# Patient Record
Sex: Male | Born: 1980
Health system: Southern US, Community
[De-identification: ages and names within clinical notes are randomized; demographics above are authoritative.]

---

## 2008-09-25 ENCOUNTER — Ambulatory Visit: Payer: Self-pay | Admitting: Internal Medicine

## 2010-08-21 ENCOUNTER — Ambulatory Visit: Payer: Self-pay | Admitting: Internal Medicine

## 2011-01-06 ENCOUNTER — Ambulatory Visit: Payer: Self-pay | Admitting: Internal Medicine

## 2011-03-09 IMAGING — CR RIGHT MIDDLE FINGER 2+V
1 series · 3 of 3 positions shown · non-contrast
Comparison: none

REASON FOR EXAM: injury
COMMENTS:   LMP: (Male)

PROCEDURE:     MDR - MDR FINGER MID 3RD DIGIT RT HA  - September 25, 2008 [DATE]
RESULT:     Comparison:  None

[Series 1: view not recorded · 0.17mm/px · 3 of 3 slices shown]
[im 1/3]
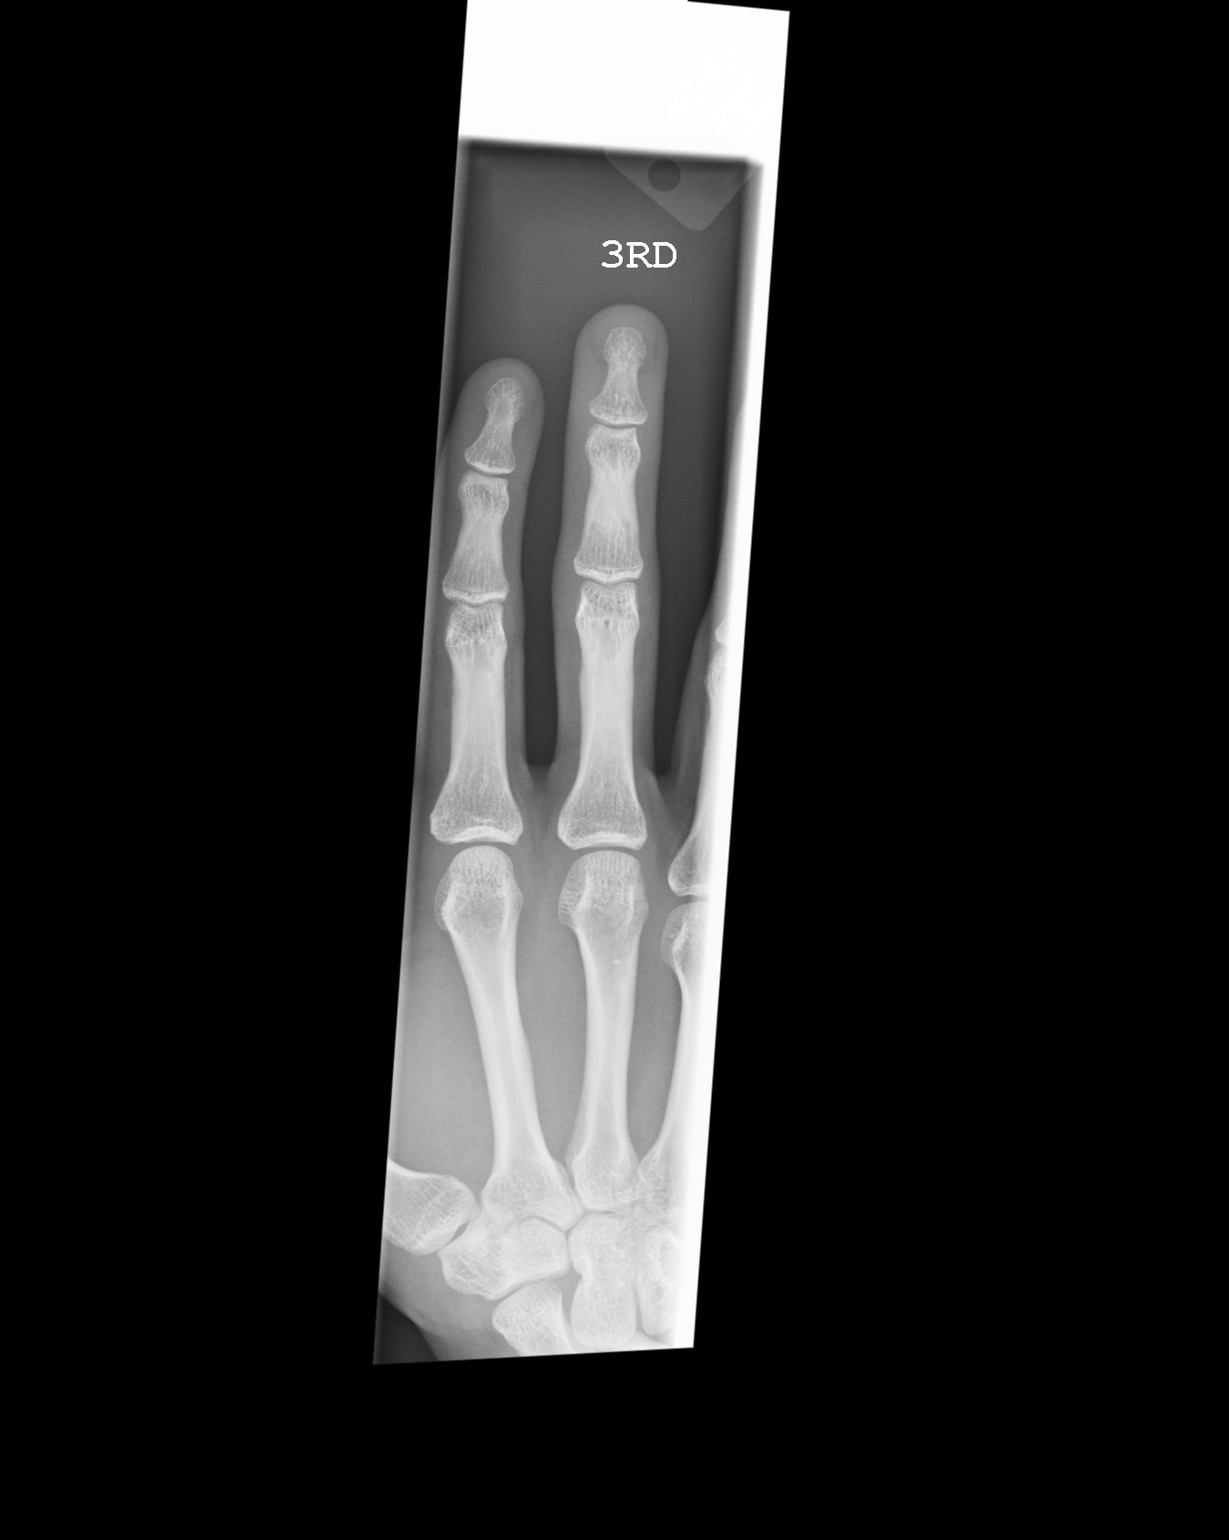
[im 2/3]
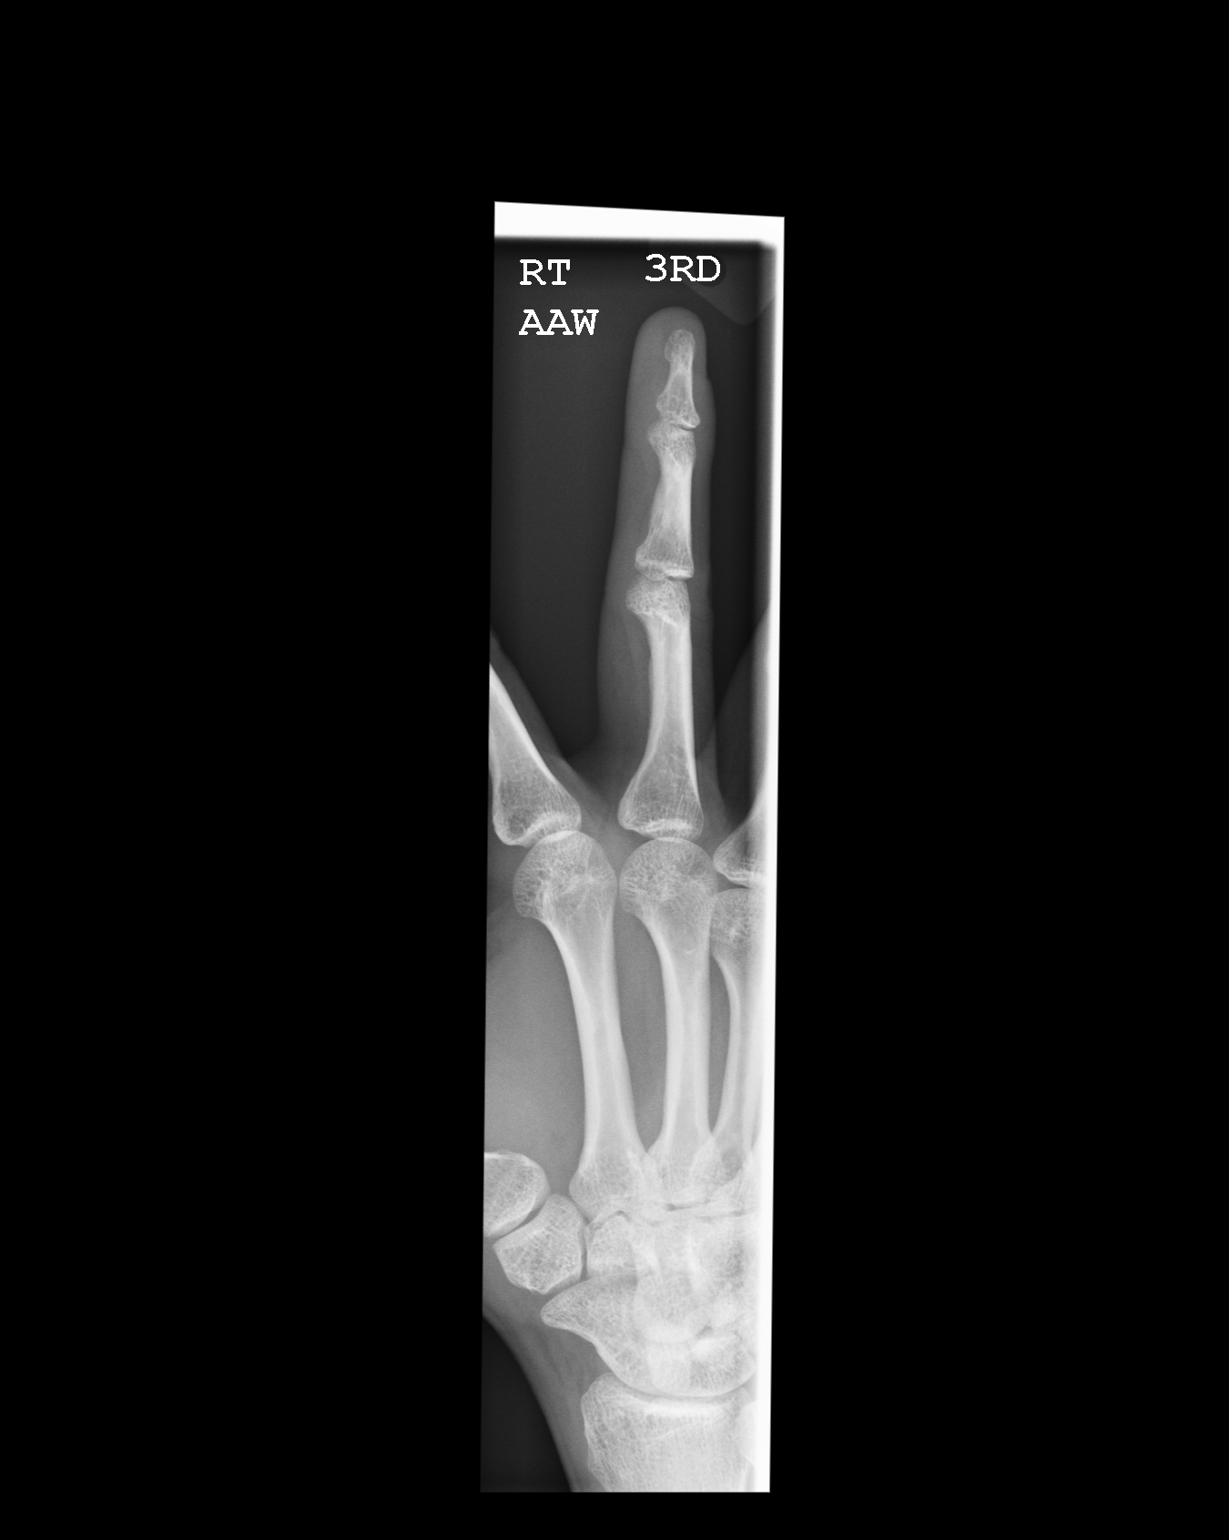
[im 3/3]
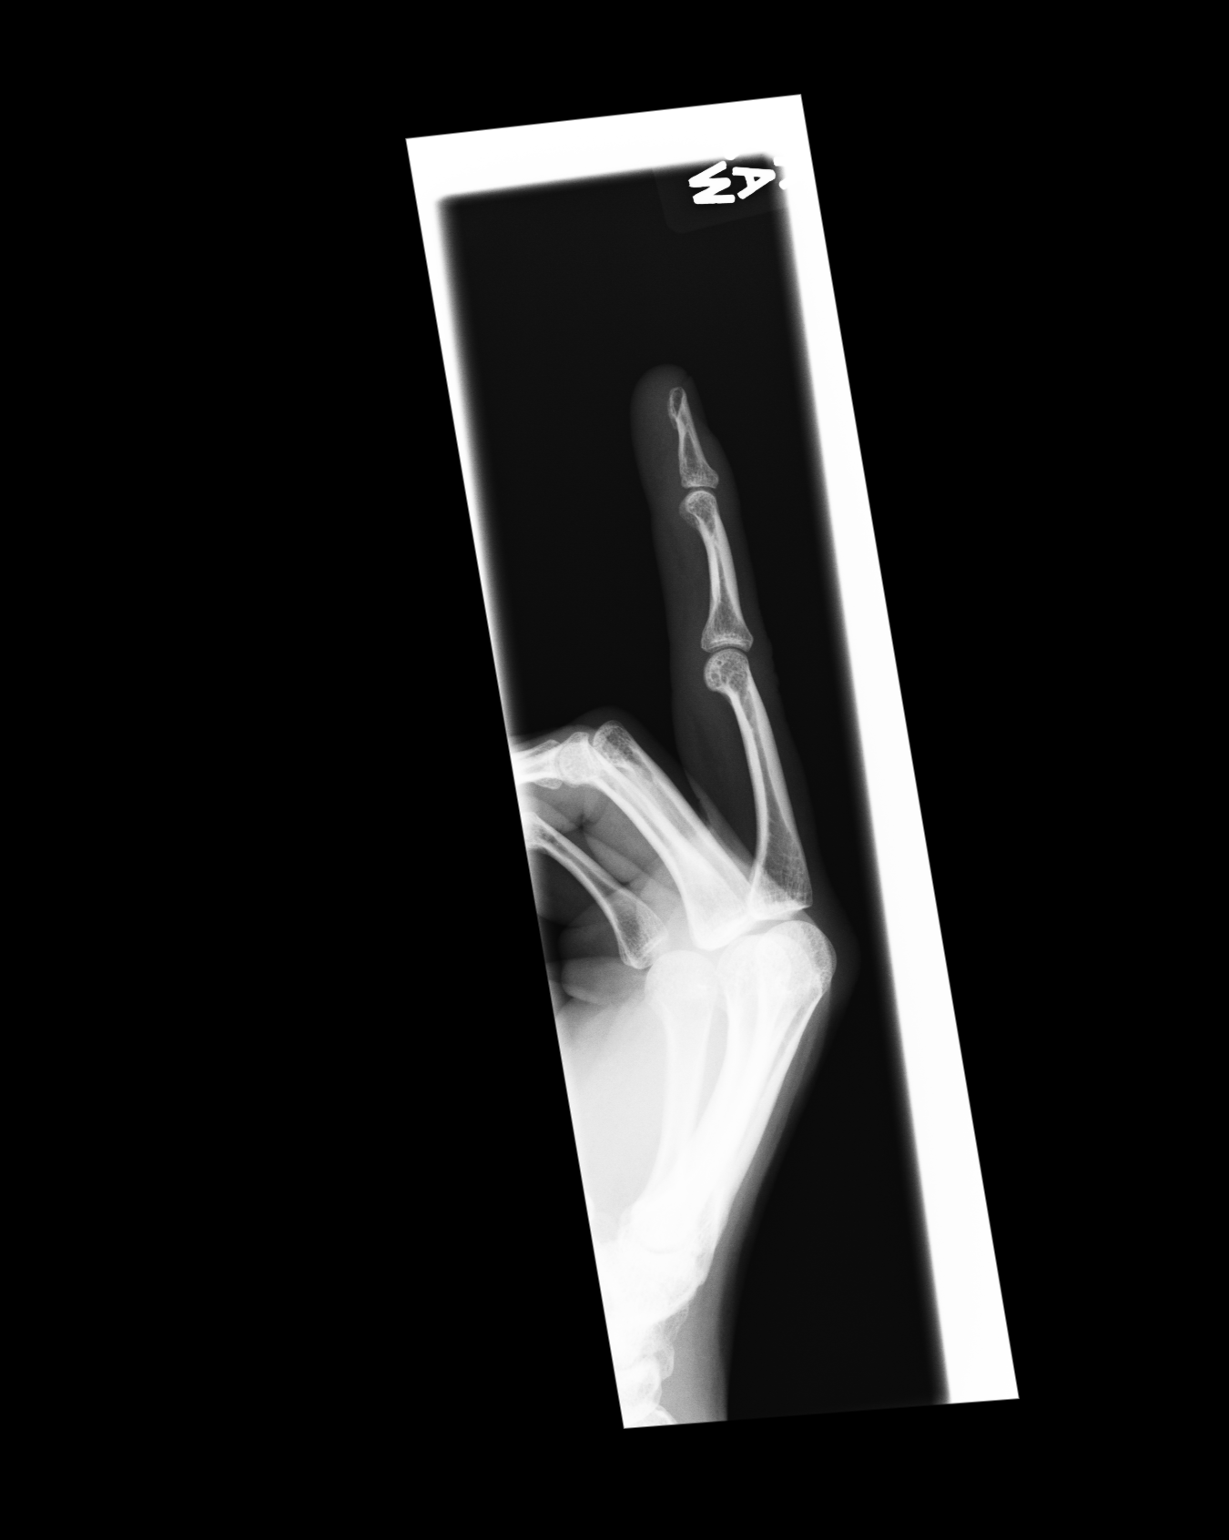

[3 of 3 positions shown; findings below may reference images not displayed]

FINDINGS: Three coned-down views of the right third digit demonstrates no fracture or
dislocation. The soft tissues are normal.
IMPRESSION: No acute osseous injury of the right third digit .

## 2011-12-14 ENCOUNTER — Ambulatory Visit: Payer: Self-pay

## 2013-06-19 IMAGING — CR DG CHEST 2V
1 series · 2 of 2 positions shown · non-contrast
Comparison: none

REASON FOR EXAM: congestion
COMMENTS:   LMP: (Male)

PROCEDURE:     MDR - MDR CHEST PA(OR AP) AND LATERAL  - January 06, 2011 [DATE]
RESULT:     The lungs are clear. The cardiac silhouette and visualized bony
skeleton are unremarkable.

[Series 1: pa · 0.17mm/px · 2 of 2 slices shown]
[im 1/2]
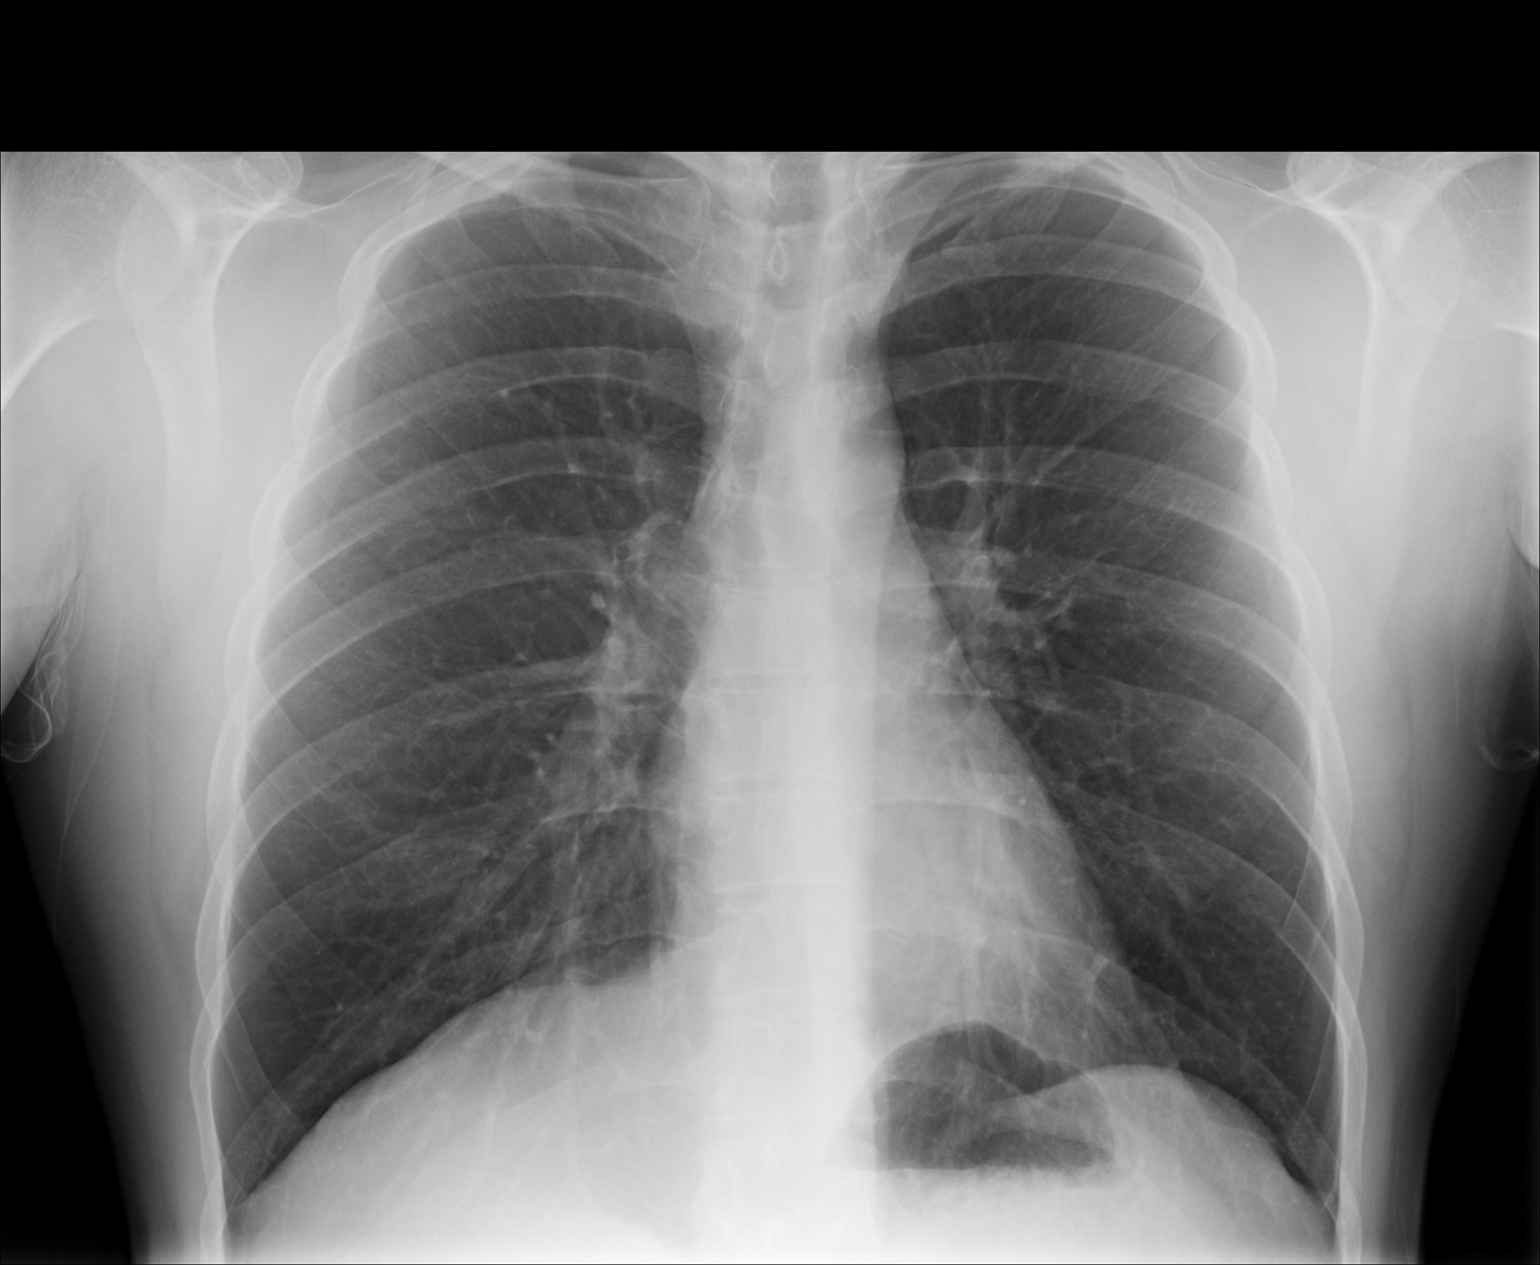
[im 2/2]
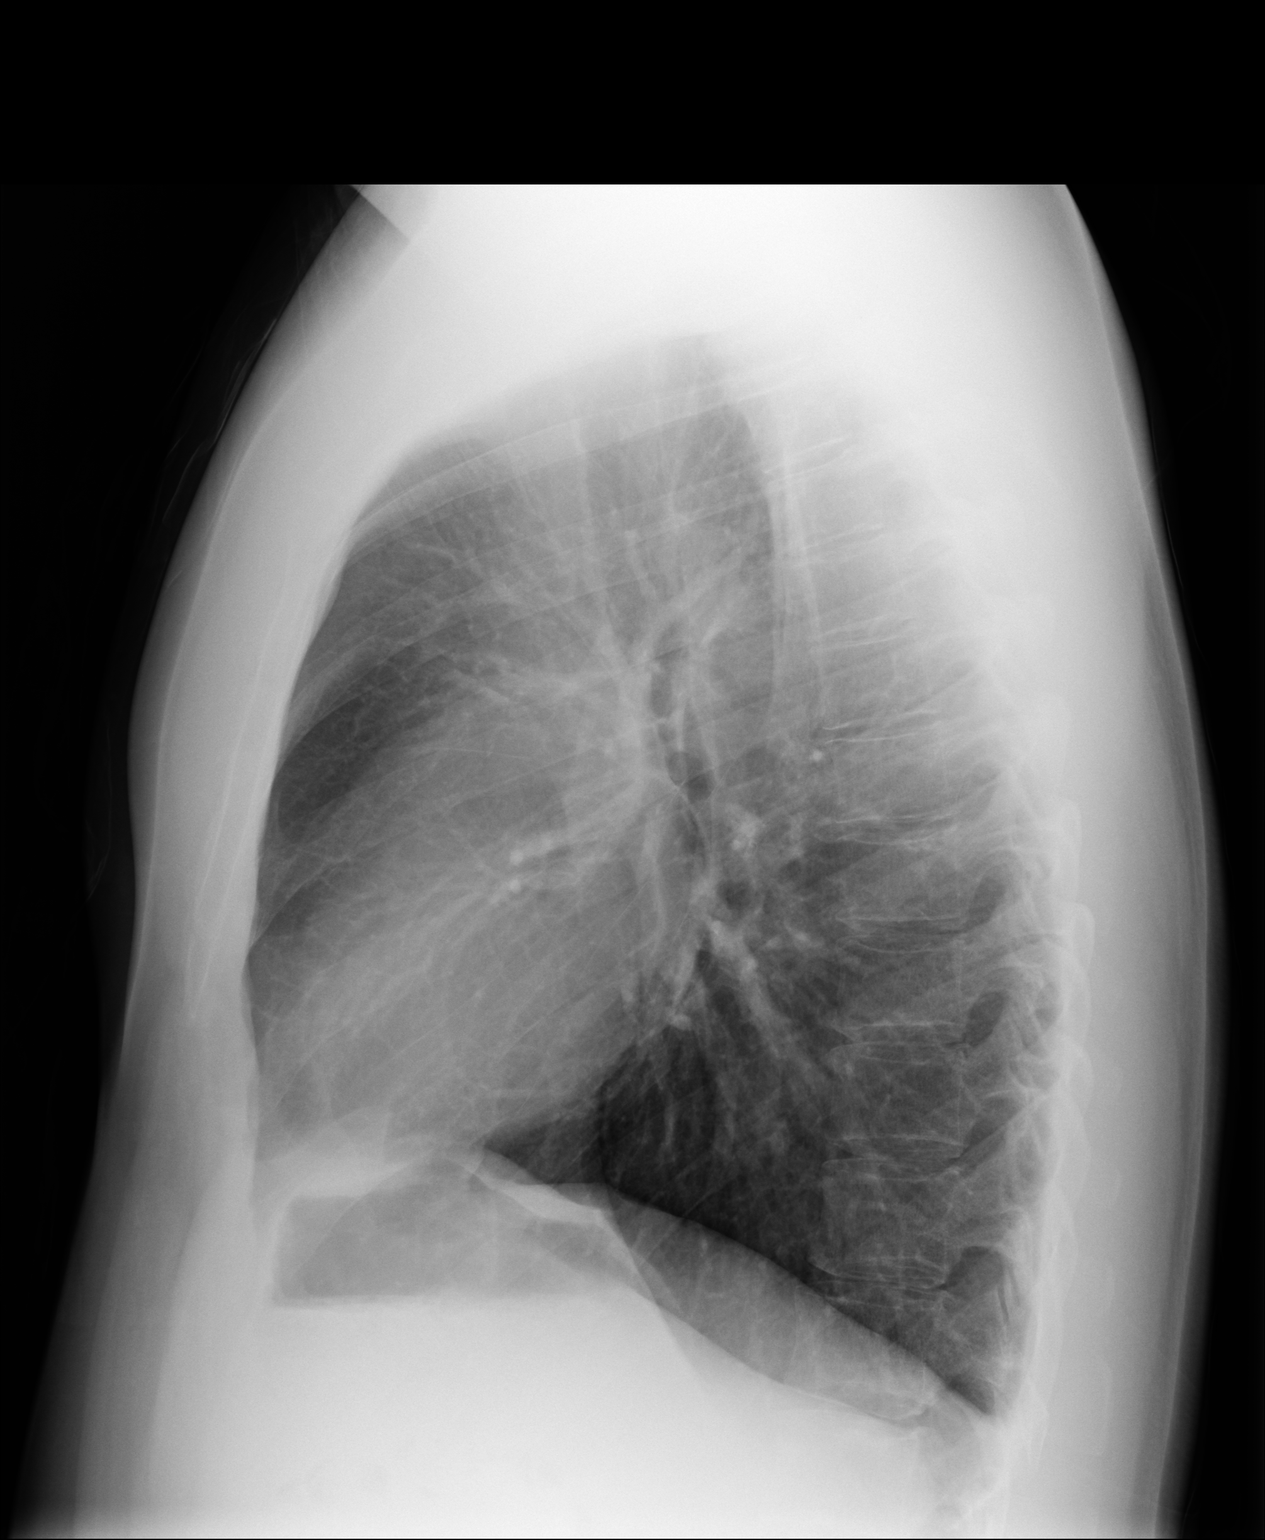

[2 of 2 positions shown; findings below may reference images not displayed]

IMPRESSION: 1. Chest radiograph without evidence of acute cardiopulmonary disease.

## 2013-09-12 ENCOUNTER — Ambulatory Visit: Payer: Self-pay

## 2016-02-10 DIAGNOSIS — Z0000 Encounter for general adult medical examination without abnormal findings: Secondary | ICD-10-CM | POA: Diagnosis not present

## 2016-02-10 DIAGNOSIS — Z13220 Encounter for screening for lipoid disorders: Secondary | ICD-10-CM | POA: Diagnosis not present

## 2016-03-27 DIAGNOSIS — R748 Abnormal levels of other serum enzymes: Secondary | ICD-10-CM | POA: Diagnosis not present

## 2016-03-27 DIAGNOSIS — R740 Nonspecific elevation of levels of transaminase and lactic acid dehydrogenase [LDH]: Secondary | ICD-10-CM | POA: Diagnosis not present

## 2016-05-14 DIAGNOSIS — R591 Generalized enlarged lymph nodes: Secondary | ICD-10-CM | POA: Diagnosis not present

## 2016-09-22 DIAGNOSIS — J0101 Acute recurrent maxillary sinusitis: Secondary | ICD-10-CM | POA: Diagnosis not present

## 2016-10-22 DIAGNOSIS — J069 Acute upper respiratory infection, unspecified: Secondary | ICD-10-CM | POA: Diagnosis not present

## 2016-10-22 DIAGNOSIS — R590 Localized enlarged lymph nodes: Secondary | ICD-10-CM | POA: Diagnosis not present

## 2016-12-02 DIAGNOSIS — Z23 Encounter for immunization: Secondary | ICD-10-CM | POA: Diagnosis not present

## 2017-02-11 DIAGNOSIS — L308 Other specified dermatitis: Secondary | ICD-10-CM | POA: Diagnosis not present

## 2017-02-11 DIAGNOSIS — E7800 Pure hypercholesterolemia, unspecified: Secondary | ICD-10-CM | POA: Diagnosis not present

## 2017-02-11 DIAGNOSIS — Z0000 Encounter for general adult medical examination without abnormal findings: Secondary | ICD-10-CM | POA: Diagnosis not present

## 2017-12-18 DIAGNOSIS — Z23 Encounter for immunization: Secondary | ICD-10-CM | POA: Diagnosis not present

## 2018-02-17 ENCOUNTER — Other Ambulatory Visit: Payer: Self-pay | Admitting: Family Medicine

## 2018-02-17 DIAGNOSIS — R748 Abnormal levels of other serum enzymes: Secondary | ICD-10-CM

## 2018-02-19 ENCOUNTER — Ambulatory Visit
Admission: RE | Admit: 2018-02-19 | Discharge: 2018-02-19 | Disposition: A | Discharge status: Home / Self Care | Payer: 59 | Source: Ambulatory Visit | Attending: Family Medicine | Admitting: Family Medicine

## 2018-02-19 DIAGNOSIS — R748 Abnormal levels of other serum enzymes: Secondary | ICD-10-CM

## 2018-05-26 DIAGNOSIS — R740 Nonspecific elevation of levels of transaminase and lactic acid dehydrogenase [LDH]: Secondary | ICD-10-CM | POA: Diagnosis not present

## 2018-05-26 DIAGNOSIS — D72819 Decreased white blood cell count, unspecified: Secondary | ICD-10-CM | POA: Diagnosis not present

## 2018-05-26 DIAGNOSIS — Z789 Other specified health status: Secondary | ICD-10-CM | POA: Diagnosis not present

## 2018-05-26 DIAGNOSIS — E782 Mixed hyperlipidemia: Secondary | ICD-10-CM | POA: Diagnosis not present

## 2018-08-25 DIAGNOSIS — R740 Nonspecific elevation of levels of transaminase and lactic acid dehydrogenase [LDH]: Secondary | ICD-10-CM | POA: Diagnosis not present

## 2018-08-25 DIAGNOSIS — D72819 Decreased white blood cell count, unspecified: Secondary | ICD-10-CM | POA: Diagnosis not present

## 2018-12-08 ENCOUNTER — Other Ambulatory Visit: Payer: Self-pay

## 2018-12-08 DIAGNOSIS — Z20822 Contact with and (suspected) exposure to covid-19: Secondary | ICD-10-CM

## 2018-12-10 LAB — NOVEL CORONAVIRUS, NAA: SARS-CoV-2, NAA: NOT DETECTED

## 2019-04-22 IMAGING — US US ABDOMEN COMPLETE
1 series · 14 of 25 positions shown · non-contrast
Comparison: None.

CLINICAL DATA: Elevated liver function tests.

EXAM:
ABDOMEN ULTRASOUND COMPLETE

[Series 1: us abdomen complete · 0.19mm/px · 14 of 83 slices shown]
[im 1/83]
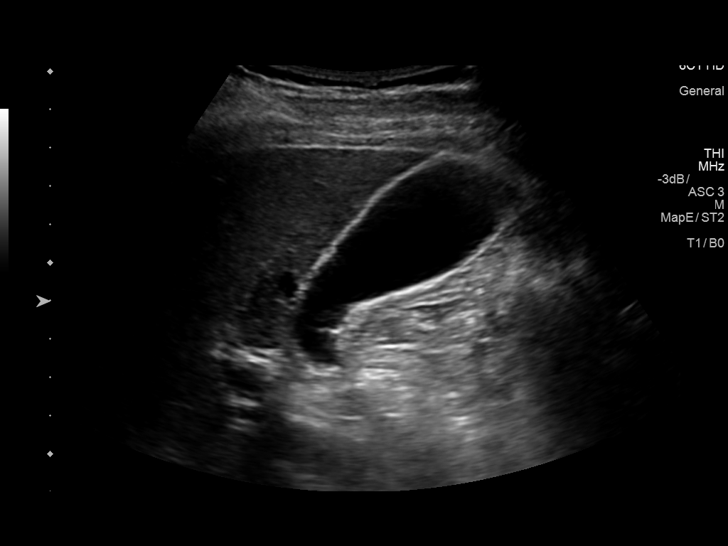
[im 7/83]
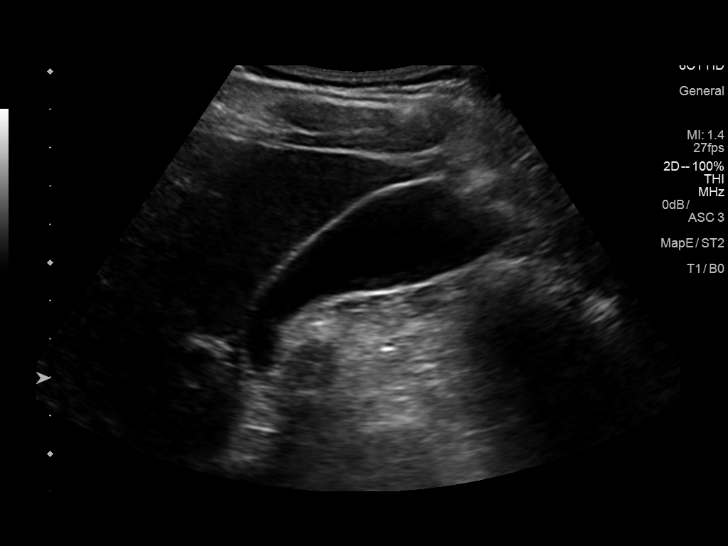
[im 14/83]
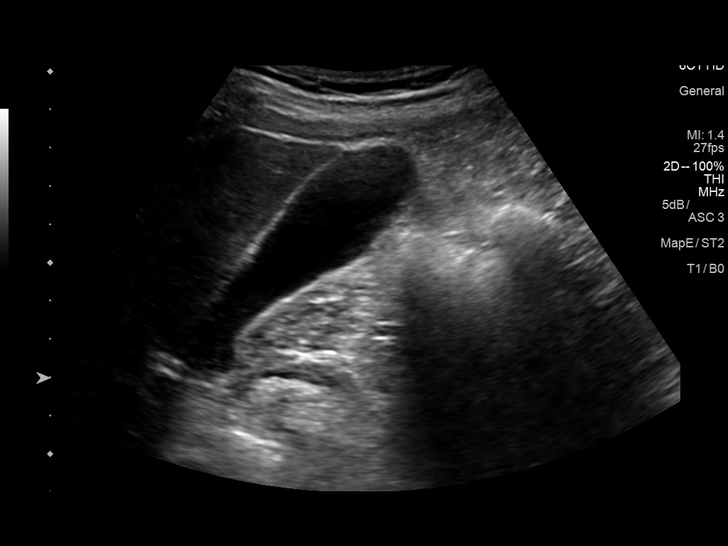
[im 21/83]
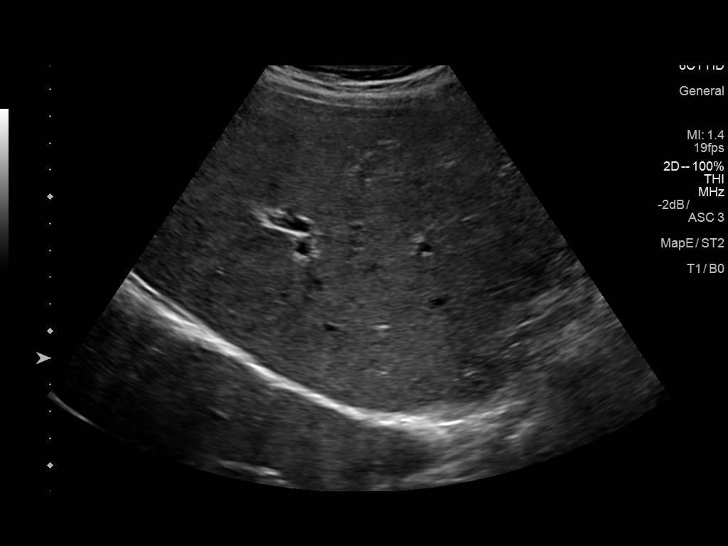
[im 28/83]
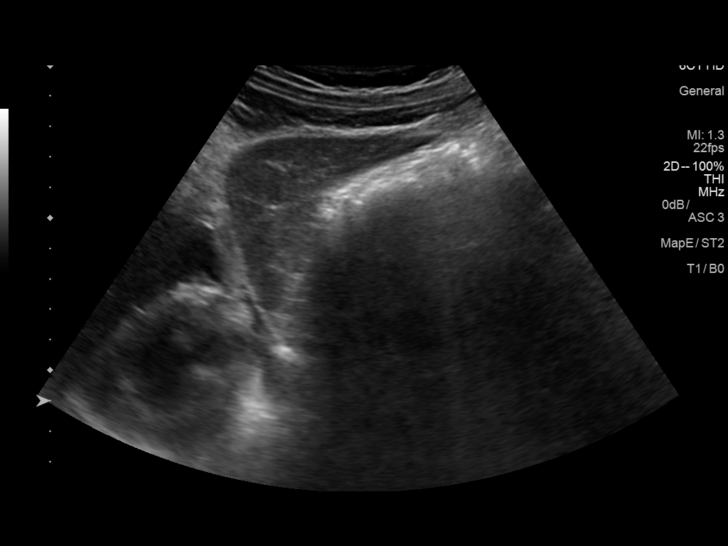
[im 31/83]
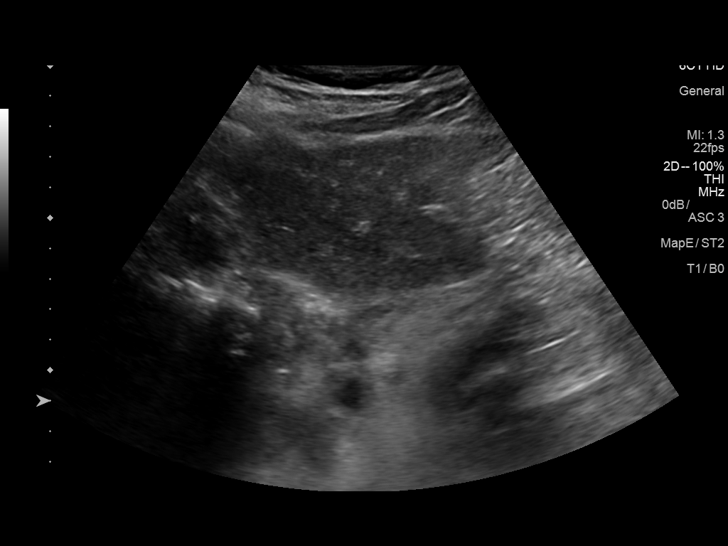
[im 38/83]
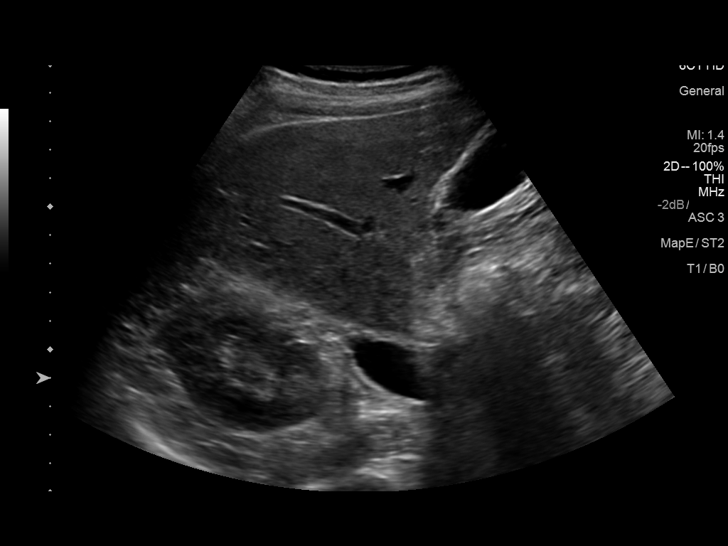
[im 45/83]
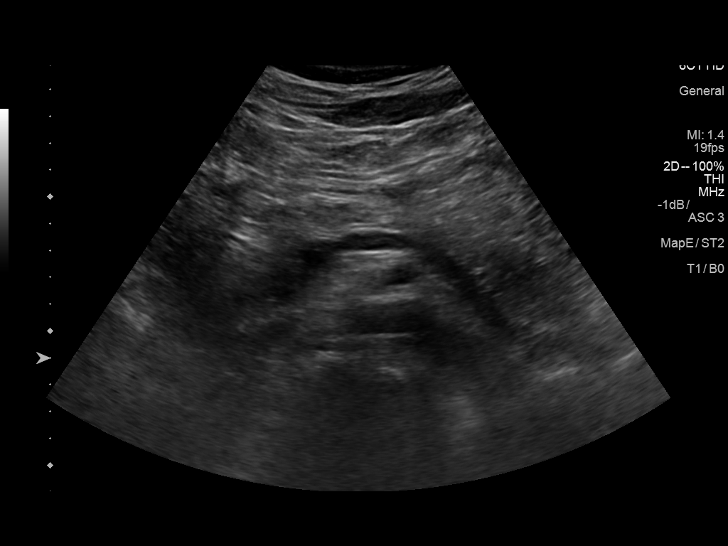
[im 52/83]
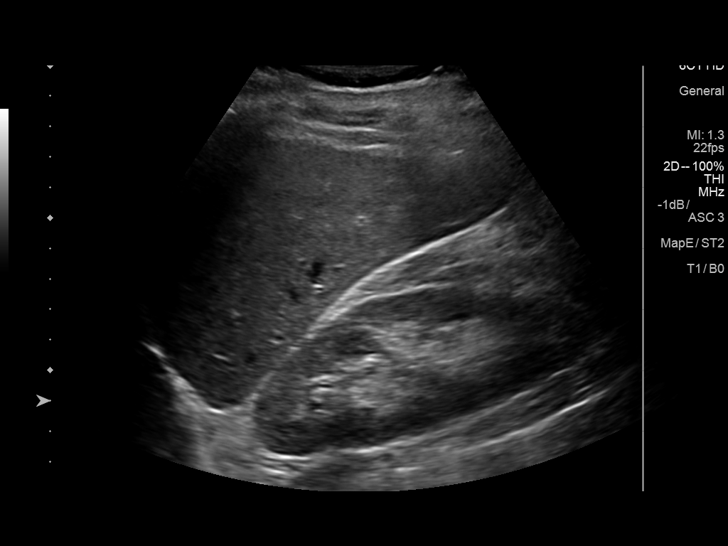
[im 55/83]
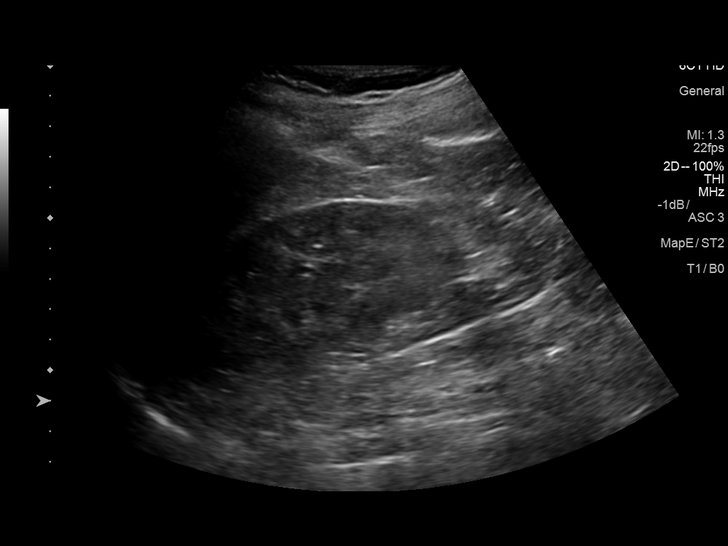
[im 62/83]
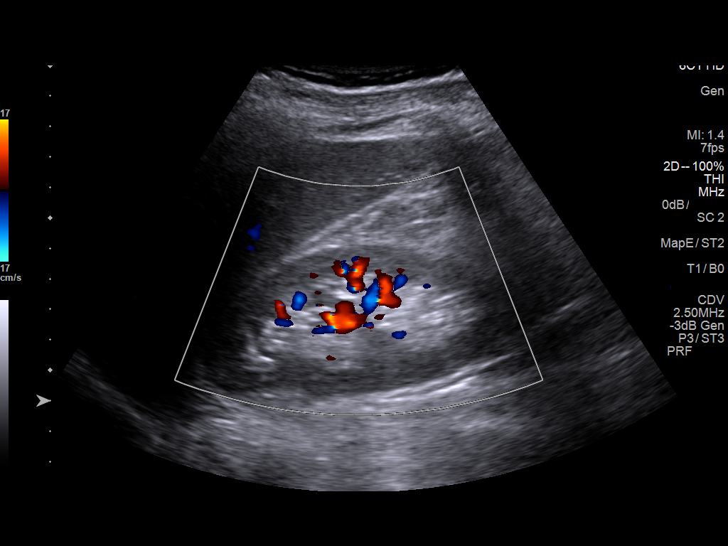
[im 69/83]
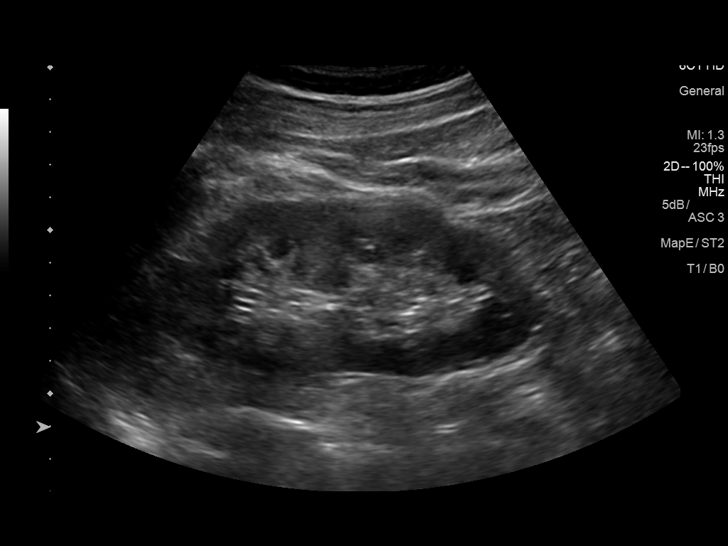
[im 76/83]
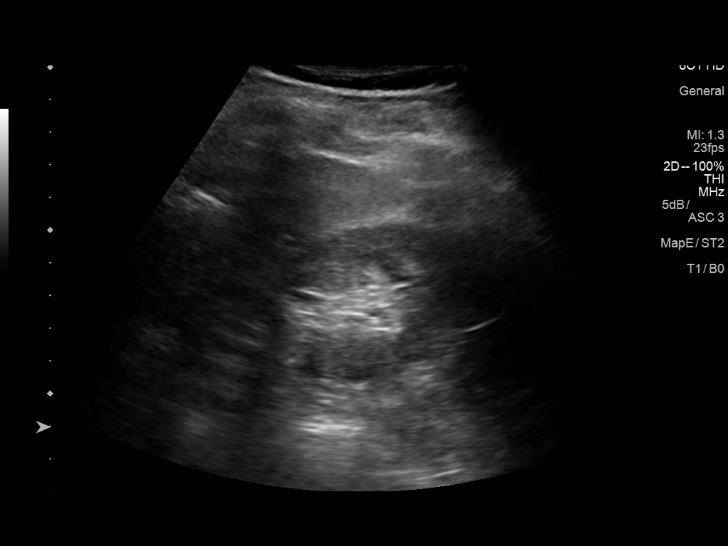
[im 83/83]
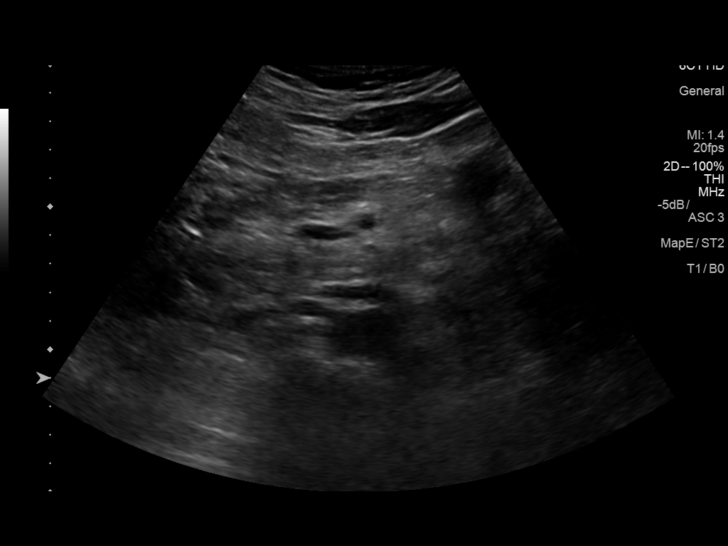

[14 of 25 positions shown; findings below may reference images not displayed]

FINDINGS: Gallbladder: No gallstones or wall thickening visualized. No
sonographic Murphy sign noted by sonographer.

Common bile duct: Diameter: 2.4 mm.  Normal.

Liver: No focal lesion identified. Within normal limits in
parenchymal echogenicity. Portal vein is patent on color Doppler
imaging with normal direction of blood flow towards the liver.

IVC: No abnormality visualized.

Pancreas: Visualized portion unremarkable.

Spleen: Size and appearance within normal limits.

Right Kidney: Length: 12.0 cm. Echogenicity within normal limits. No
mass or hydronephrosis visualized.

Left Kidney: Length: 12.2 cm. Echogenicity within normal limits. No
mass or hydronephrosis visualized.

Abdominal aorta: No aneurysm visualized.

Other findings: No ascites.
IMPRESSION: Normal abdominal ultrasound. No abnormality seen to explain elevated
liver function tests. Specifically, no evidence gallstone disease.
Normal liver echogenicity.

## 2019-05-18 DIAGNOSIS — Z23 Encounter for immunization: Secondary | ICD-10-CM | POA: Diagnosis not present

## 2019-05-18 DIAGNOSIS — R7401 Elevation of levels of liver transaminase levels: Secondary | ICD-10-CM | POA: Diagnosis not present

## 2019-05-18 DIAGNOSIS — Z13220 Encounter for screening for lipoid disorders: Secondary | ICD-10-CM | POA: Diagnosis not present

## 2019-05-18 DIAGNOSIS — Z0000 Encounter for general adult medical examination without abnormal findings: Secondary | ICD-10-CM | POA: Diagnosis not present

## 2019-08-25 DIAGNOSIS — Z20828 Contact with and (suspected) exposure to other viral communicable diseases: Secondary | ICD-10-CM | POA: Diagnosis not present

## 2019-09-21 DIAGNOSIS — Z20828 Contact with and (suspected) exposure to other viral communicable diseases: Secondary | ICD-10-CM | POA: Diagnosis not present

## 2019-10-03 DIAGNOSIS — Z20828 Contact with and (suspected) exposure to other viral communicable diseases: Secondary | ICD-10-CM | POA: Diagnosis not present

## 2019-11-17 ENCOUNTER — Telehealth: Payer: 59 | Admitting: Physician Assistant

## 2019-11-17 DIAGNOSIS — J019 Acute sinusitis, unspecified: Secondary | ICD-10-CM | POA: Diagnosis not present

## 2019-11-17 MED ORDER — AMOXICILLIN-POT CLAVULANATE 875-125 MG PO TABS: 1.0000 | ORAL_TABLET | Freq: Two times a day (BID) | ORAL | 0 refills | Status: DC

## 2019-11-17 NOTE — Progress Notes (Signed)

## 2020-06-03 ENCOUNTER — Telehealth: Payer: Self-pay | Admitting: Emergency Medicine

## 2020-06-03 DIAGNOSIS — J019 Acute sinusitis, unspecified: Secondary | ICD-10-CM

## 2020-06-03 MED ORDER — AMOXICILLIN-POT CLAVULANATE 875-125 MG PO TABS: 1.0000 | ORAL_TABLET | Freq: Two times a day (BID) | ORAL | 0 refills | Status: DC

## 2020-06-03 NOTE — Progress Notes (Signed)

## 2020-08-12 ENCOUNTER — Telehealth: Payer: No Typology Code available for payment source | Admitting: Physician Assistant

## 2020-08-12 DIAGNOSIS — J029 Acute pharyngitis, unspecified: Secondary | ICD-10-CM

## 2020-08-12 MED ORDER — IPRATROPIUM BROMIDE 0.03 % NA SOLN: 2.0000 | Freq: Two times a day (BID) | NASAL | 0 refills | Status: AC

## 2020-08-12 MED ORDER — FLUTICASONE PROPIONATE 50 MCG/ACT NA SUSP: 2.0000 | Freq: Every day | NASAL | 0 refills | Status: AC

## 2020-08-12 NOTE — Progress Notes (Signed)
E-Visit for Sinus Problems  We are sorry that you are not feeling well.  Here is how we plan to help!  Based on what you have shared with me it looks like you have sinusitis.  Sinusitis is inflammation and infection in the sinus cavities of the head.  Based on your presentation I believe you most likely have Acute Viral Sinusitis.This is an infection most likely caused by a virus. There is not specific treatment for viral sinusitis other than to help you with the symptoms until the infection runs its course.  You may use an oral decongestant such as Mucinex D or if you have glaucoma or high blood pressure use plain Mucinex. Saline nasal spray help and can safely be used as often as needed for congestion, I have prescribed: Ipratropium Bromide nasal spray 0.03% 2 sprays in eah nostril 2-3 times a day and Fluticasone nasal spray 2 sprays each nostril daily.   Some authorities believe that zinc tablets or the use of Echinacea may shorten the course of your symptoms.  Sinus infections are not as easily transmitted as other respiratory infection, however we still recommend that you avoid close contact with loved ones, especially the very young and elderly.  Remember to wash your hands thoroughly throughout the day as this is the number one way to prevent the spread of infection!  Home Care: Only take medications as instructed by your medical team. Do not take these medications with alcohol. A steam or ultrasonic humidifier can help congestion.  You can place a towel over your head and breathe in the steam from hot water coming from a faucet. Avoid close contacts especially the very young and the elderly. Cover your mouth when you cough or sneeze. Always remember to wash your hands.  Get Help Right Away If: You develop worsening fever or sinus pain. You develop a severe head ache or visual changes. Your symptoms persist after you have completed your treatment plan.  Make sure you Understand these  instructions. Will watch your condition. Will get help right away if you are not doing well or get worse.   Thank you for choosing an e-visit.  Your e-visit answers were reviewed by a board certified advanced clinical practitioner to complete your personal care plan. Depending upon the condition, your plan could have included both over the counter or prescription medications.  Please review your pharmacy choice. Make sure the pharmacy is open so you can pick up prescription now. If there is a problem, you may contact your provider through [employer] and have the prescription routed to another pharmacy.  Your safety is important to [place]. If you have drug allergies check your prescription carefully.   For the next 24 hours you can use MyChart to ask questions about today's visit, request a non-urgent call back, or ask for a work or school excuse. You will get an email in the next two days asking about your experience. I hope that your e-visit has been valuable and will speed your recovery.  I provided 6 minutes of non face-to-face time during this encounter for chart review and documentation.

## 2020-09-03 ENCOUNTER — Other Ambulatory Visit: Payer: Self-pay | Admitting: Physician Assistant

## 2020-09-03 DIAGNOSIS — J029 Acute pharyngitis, unspecified: Secondary | ICD-10-CM

## 2021-01-21 ENCOUNTER — Telehealth: Payer: No Typology Code available for payment source | Admitting: Physician Assistant

## 2021-01-21 DIAGNOSIS — B9689 Other specified bacterial agents as the cause of diseases classified elsewhere: Secondary | ICD-10-CM | POA: Diagnosis not present

## 2021-01-21 DIAGNOSIS — J019 Acute sinusitis, unspecified: Secondary | ICD-10-CM | POA: Diagnosis not present

## 2021-01-21 MED ORDER — AMOXICILLIN-POT CLAVULANATE 875-125 MG PO TABS: 1.0000 | ORAL_TABLET | Freq: Two times a day (BID) | ORAL | 0 refills | Status: DC

## 2021-01-21 NOTE — Progress Notes (Signed)
I have spent 5 minutes in review of e-visit questionnaire, review and updating patient chart, medical decision making and response to patient.   Yassin Scales Cody Safa Derner, PA-C    

## 2021-01-21 NOTE — Progress Notes (Signed)

## 2021-11-10 ENCOUNTER — Other Ambulatory Visit (HOSPITAL_COMMUNITY): Payer: Self-pay

## 2021-11-10 ENCOUNTER — Telehealth: Payer: No Typology Code available for payment source | Admitting: Emergency Medicine

## 2021-11-10 DIAGNOSIS — B9689 Other specified bacterial agents as the cause of diseases classified elsewhere: Secondary | ICD-10-CM

## 2021-11-10 DIAGNOSIS — J019 Acute sinusitis, unspecified: Secondary | ICD-10-CM | POA: Diagnosis not present

## 2021-11-10 MED ORDER — AMOXICILLIN-POT CLAVULANATE 875-125 MG PO TABS
1.0000 | ORAL_TABLET | Freq: Two times a day (BID) | ORAL | 0 refills | Status: DC
  Filled 2021-11-10: qty 14, 7d supply, fill #0

## 2021-11-10 NOTE — Progress Notes (Signed)
E-Visit for Sinus Problems  We are sorry that you are not feeling well.  Here is how we plan to help!  Based on what you have shared with me it looks like you have sinusitis.  Sinusitis is inflammation and infection in the sinus cavities of the head.  Based on your presentation I believe you most likely have Acute Bacterial Sinusitis.  This is an infection caused by bacteria and is treated with antibiotics. I have prescribed Augmentin 875mg/125mg one tablet twice daily with food, for 7 days.   You may use an oral decongestant such as Mucinex D or if you have glaucoma or high blood pressure use plain Mucinex.   Saline nasal spray help and can safely be used as often as needed for congestion.  Try using saline irrigation, such as with a neti pot, several times a day while you are sick. Many neti pots come with salt packets premeasured to use to make saline. If you use your own salt, make sure it is kosher salt or sea salt (don't use table salt as it has iodine in it and you don't need that in your nose). Use distilled water to make saline. If you mix your own saline using your own salt, the recipe is 1/4 teaspoon salt in 1 cup warm water. Using saline irrigation can help prevent and treat sinus infections.   If you develop worsening sinus pain, fever or notice severe headache and vision changes, or if symptoms are not better after completion of antibiotic, please schedule an appointment with a health care provider.    Sinus infections are not as easily transmitted as other respiratory infection, however we still recommend that you avoid close contact with loved ones, especially the very young and elderly.  Remember to wash your hands thoroughly throughout the day as this is the number one way to prevent the spread of infection!  Home Care: Only take medications as instructed by your medical team. Complete the entire course of an antibiotic. Do not take these medications with alcohol. A steam or  ultrasonic humidifier can help congestion.  You can place a towel over your head and breathe in the steam from hot water coming from a faucet. Avoid close contacts especially the very young and the elderly. Cover your mouth when you cough or sneeze. Always remember to wash your hands.  Get Help Right Away If: You develop worsening fever or sinus pain. You develop a severe head ache or visual changes. Your symptoms persist after you have completed your treatment plan.  Make sure you Understand these instructions. Will watch your condition. Will get help right away if you are not doing well or get worse.  Thank you for choosing an e-visit.  Your e-visit answers were reviewed by a board certified advanced clinical practitioner to complete your personal care plan. Depending upon the condition, your plan could have included both over the counter or prescription medications.  Please review your pharmacy choice. Make sure the pharmacy is open so you can pick up prescription now. If there is a problem, you may contact your provider through MyChart messaging and have the prescription routed to another pharmacy.  Your safety is important to us. If you have drug allergies check your prescription carefully.   For the next 24 hours you can use MyChart to ask questions about today's visit, request a non-urgent call back, or ask for a work or school excuse. You will get an email in the next two days asking   about your experience. I hope that your e-visit has been valuable and will speed your recovery.  I have spent 5 minutes in review of e-visit questionnaire, review and updating patient chart, medical decision making and response to patient.   Daniel Johndrow, PhD, FNP-BC   

## 2022-04-23 DIAGNOSIS — D2362 Other benign neoplasm of skin of left upper limb, including shoulder: Secondary | ICD-10-CM | POA: Diagnosis not present

## 2022-04-23 DIAGNOSIS — L820 Inflamed seborrheic keratosis: Secondary | ICD-10-CM | POA: Diagnosis not present

## 2022-04-23 DIAGNOSIS — L821 Other seborrheic keratosis: Secondary | ICD-10-CM | POA: Diagnosis not present

## 2022-04-23 DIAGNOSIS — D485 Neoplasm of uncertain behavior of skin: Secondary | ICD-10-CM | POA: Diagnosis not present

## 2022-04-23 DIAGNOSIS — Z85828 Personal history of other malignant neoplasm of skin: Secondary | ICD-10-CM | POA: Diagnosis not present

## 2022-04-23 DIAGNOSIS — D225 Melanocytic nevi of trunk: Secondary | ICD-10-CM | POA: Diagnosis not present

## 2022-04-23 DIAGNOSIS — D2271 Melanocytic nevi of right lower limb, including hip: Secondary | ICD-10-CM | POA: Diagnosis not present

## 2022-04-23 DIAGNOSIS — D2272 Melanocytic nevi of left lower limb, including hip: Secondary | ICD-10-CM | POA: Diagnosis not present

## 2022-04-23 DIAGNOSIS — L570 Actinic keratosis: Secondary | ICD-10-CM | POA: Diagnosis not present

## 2022-04-23 DIAGNOSIS — L738 Other specified follicular disorders: Secondary | ICD-10-CM | POA: Diagnosis not present

## 2022-05-07 DIAGNOSIS — L821 Other seborrheic keratosis: Secondary | ICD-10-CM | POA: Diagnosis not present

## 2022-05-07 DIAGNOSIS — D485 Neoplasm of uncertain behavior of skin: Secondary | ICD-10-CM | POA: Diagnosis not present

## 2022-06-05 ENCOUNTER — Other Ambulatory Visit (HOSPITAL_COMMUNITY): Payer: Self-pay

## 2022-06-05 DIAGNOSIS — L0889 Other specified local infections of the skin and subcutaneous tissue: Secondary | ICD-10-CM | POA: Diagnosis not present

## 2022-06-05 MED ORDER — MUPIROCIN 2 % EX OINT
TOPICAL_OINTMENT | CUTANEOUS | 1 refills | Status: AC
  Filled 2022-06-05: qty 22, 20d supply, fill #0

## 2022-06-08 ENCOUNTER — Other Ambulatory Visit (HOSPITAL_COMMUNITY): Payer: Self-pay

## 2022-06-08 MED ORDER — CEPHALEXIN 500 MG PO CAPS
ORAL_CAPSULE | ORAL | 0 refills | Status: AC
  Filled 2022-06-08: qty 10, 5d supply, fill #0

## 2022-09-17 DIAGNOSIS — Z13220 Encounter for screening for lipoid disorders: Secondary | ICD-10-CM | POA: Diagnosis not present

## 2022-09-17 DIAGNOSIS — Z0000 Encounter for general adult medical examination without abnormal findings: Secondary | ICD-10-CM | POA: Diagnosis not present

## 2023-04-07 ENCOUNTER — Telehealth: Admitting: Family Medicine

## 2023-04-07 DIAGNOSIS — B9689 Other specified bacterial agents as the cause of diseases classified elsewhere: Secondary | ICD-10-CM | POA: Diagnosis not present

## 2023-04-07 DIAGNOSIS — J019 Acute sinusitis, unspecified: Secondary | ICD-10-CM

## 2023-04-07 MED ORDER — AMOXICILLIN-POT CLAVULANATE 875-125 MG PO TABS: 1.0000 | ORAL_TABLET | Freq: Two times a day (BID) | ORAL | 0 refills | Status: DC

## 2023-04-07 NOTE — Progress Notes (Signed)

## 2023-04-16 DIAGNOSIS — L723 Sebaceous cyst: Secondary | ICD-10-CM | POA: Diagnosis not present

## 2023-05-02 DIAGNOSIS — R002 Palpitations: Secondary | ICD-10-CM | POA: Diagnosis not present

## 2023-07-09 ENCOUNTER — Other Ambulatory Visit (HOSPITAL_COMMUNITY): Payer: Self-pay

## 2023-07-09 DIAGNOSIS — N50819 Testicular pain, unspecified: Secondary | ICD-10-CM | POA: Diagnosis not present

## 2023-07-09 MED ORDER — NAPROXEN 500 MG PO TABS
500.0000 mg | ORAL_TABLET | Freq: Two times a day (BID) | ORAL | 0 refills | Status: AC
  Filled 2023-07-09: qty 10, 5d supply, fill #0

## 2023-10-15 DIAGNOSIS — R229 Localized swelling, mass and lump, unspecified: Secondary | ICD-10-CM | POA: Diagnosis not present

## 2023-10-15 DIAGNOSIS — Z0000 Encounter for general adult medical examination without abnormal findings: Secondary | ICD-10-CM | POA: Diagnosis not present

## 2023-10-15 DIAGNOSIS — E782 Mixed hyperlipidemia: Secondary | ICD-10-CM | POA: Diagnosis not present

## 2023-10-15 DIAGNOSIS — J069 Acute upper respiratory infection, unspecified: Secondary | ICD-10-CM | POA: Diagnosis not present

## 2023-10-19 ENCOUNTER — Other Ambulatory Visit (HOSPITAL_COMMUNITY): Payer: Self-pay

## 2023-10-19 ENCOUNTER — Telehealth: Admitting: Family Medicine

## 2023-10-19 DIAGNOSIS — J019 Acute sinusitis, unspecified: Secondary | ICD-10-CM | POA: Diagnosis not present

## 2023-10-19 MED ORDER — AMOXICILLIN-POT CLAVULANATE 875-125 MG PO TABS
1.0000 | ORAL_TABLET | Freq: Two times a day (BID) | ORAL | 0 refills | Status: AC
  Filled 2023-10-19: qty 14, 7d supply, fill #0

## 2023-10-19 NOTE — Progress Notes (Signed)

## 2023-10-21 ENCOUNTER — Ambulatory Visit (INDEPENDENT_AMBULATORY_CARE_PROVIDER_SITE_OTHER)

## 2023-10-21 VITALS — BP 130/92 | HR 97 | Wt 178.6 lb

## 2023-10-21 DIAGNOSIS — R221 Localized swelling, mass and lump, neck: Secondary | ICD-10-CM

## 2023-10-21 DIAGNOSIS — L720 Epidermal cyst: Secondary | ICD-10-CM

## 2023-10-21 NOTE — Progress Notes (Signed)
 NAME: Omar Burns  MRN: [id number]  DOB: 1980-09-10   Referring physician: Rolinda Millman, MD  PCP: Rolinda Millman, MD   CHIEF COMPLAINT: Soft tissue mass of the posterior right neck    HPI:  Omar Burns is a 43 y.o. year old male who presents with a soft tissue mass that is non painful, soft and has enlarged over the last few months slowly.No episodes of infection noted. Patient does not smoke or Vape. Has had one cyst removed from his left face and one I&D on his back.    PMH: History reviewed. No pertinent past medical history.  PSH:  History reviewed. No pertinent surgical history.   MEDICATIONS:   Current Outpatient Medications:    amoxicillin -clavulanate (AUGMENTIN ) 875-125 MG tablet, Take 1 tablet by mouth 2 (two) times daily for 7 days., Disp: 14 tablet, Rfl: 0   fluticasone  (FLONASE ) 50 MCG/ACT nasal spray, Place 2 sprays into both nostrils daily., Disp: 16 g, Rfl: 0   cephALEXin  (KEFLEX ) 500 MG capsule, Take 1 capsule by mouth twice a day for 5 days. (Patient not taking: Reported on 10/21/2023), Disp: 10 capsule, Rfl: 0   ipratropium (ATROVENT ) 0.03 % nasal spray, Place 2 sprays into both nostrils every 12 (twelve) hours. (Patient not taking: Reported on 10/21/2023), Disp: 30 mL, Rfl: 0   mupirocin  ointment (BACTROBAN ) 2 %, Apply to affected area twice daily with bandage changes. (Patient not taking: Reported on 10/21/2023), Disp: 22 g, Rfl: 1   naproxen  (NAPROSYN ) 500 MG tablet, Take 1 tablet (500 mg total) by mouth with food or milk every 12 (twelve) hours. (Patient not taking: Reported on 10/21/2023), Disp: 10 tablet, Rfl: 0   ALLERGIES:  is allergic to sulfamethoxazole.   FAMILY HISTORY:  History reviewed. No pertinent family history.    VITALS:  Vitals:   10/21/23 1132  BP: (!) 130/92  Pulse: 97    Constitutional: Good color, good hydration. VSS. Head and Neck: No lymphadenopathy, thyromegaly or masses  Chest: Normal breathing, Normal shape  and excursion.  Subcutaneous mass measures 2 cm x 2 cm. Currently not infected.  Mobile and not attached to underlying structures.  No basin lymphadenopathy, satellite lesions or in-transit lesions.   ASSESSMENT/PLAN  Assessment & Plan   Pt. presents with a subcutaneous mass most representative of epidermal inclusion cyst.  Today we discussed the risks, benefits and alternatives to mass excision and possible tissue rearrangement. We discussed the alternatives which include continued observation; however, I told the patient that I do not believe this mass will resolve on its own. We then discussed the benefits of surgical excision which include complete removal of the lesion. We discussed the risks of excision which include seroma, hematoma, infection, bleeding, damage to surrounding healthy tissue, damage to surrounding structures and need for further surgery. We also discussed the risks of hair loss, wound separation and recurrence of the mass. We discussed scar patterns of tissue rearrangement, if needed.  I explained that the mass will be sent to pathology and if it were to be malignant further surgery may be needed. We discussed the risks of anesthesia.. The patient has a good understanding of all the risks and benefits, postoperative course and care. We obtained pictures. All questions were answered.     Recommend excision in the treatment room. Risks, benefits and limitations were discussed. Procedure will be precertified and scheduled.

## 2023-11-11 ENCOUNTER — Ambulatory Visit (INDEPENDENT_AMBULATORY_CARE_PROVIDER_SITE_OTHER)

## 2023-11-11 ENCOUNTER — Other Ambulatory Visit (HOSPITAL_COMMUNITY)
Admission: RE | Admit: 2023-11-11 | Discharge: 2023-11-11 | Disposition: A | Discharge status: Home / Self Care | Source: Ambulatory Visit

## 2023-11-11 ENCOUNTER — Other Ambulatory Visit (HOSPITAL_COMMUNITY): Payer: Self-pay

## 2023-11-11 VITALS — BP 123/82 | HR 92

## 2023-11-11 DIAGNOSIS — L720 Epidermal cyst: Secondary | ICD-10-CM | POA: Insufficient documentation

## 2023-11-11 MED ORDER — ACETAMINOPHEN 500 MG PO TABS
500.0000 mg | ORAL_TABLET | Freq: Four times a day (QID) | ORAL | 0 refills | Status: AC | PRN
  Filled 2023-11-11: qty 30, 8d supply, fill #0

## 2023-11-11 MED ORDER — DOXYCYCLINE HYCLATE 100 MG PO TABS
100.0000 mg | ORAL_TABLET | Freq: Two times a day (BID) | ORAL | 0 refills | Status: AC
  Filled 2023-11-11: qty 14, 7d supply, fill #0

## 2023-11-11 MED ORDER — IBUPROFEN 600 MG PO TABS
600.0000 mg | ORAL_TABLET | Freq: Three times a day (TID) | ORAL | 0 refills | Status: AC | PRN
  Filled 2023-11-11: qty 30, 10d supply, fill #0

## 2023-11-11 NOTE — Progress Notes (Addendum)
 Procedure Note  Preoperative Dx: Right posterior neck cyst  Postoperative Dx: Same  Procedure: Excision of right posterior neck cyst 2.0 cm x 1.5 cm x 0.4 cm   Anesthesia: Lidocaine 1% with 1:100,000 epinephrine  Indication for Procedure: Right posterior neck cyst, suspicion for epidermal inclusion cyst, mildly inflamed  Description of Procedure: Risks and complications were explained to the patient bleeding infection seroma hematoma wound related complications need for additional surgeries and recurrence. Mass will be sent to pathology.  Consent was confirmed and the patient understands the risks and benefits.  The potential complications and alternatives were explained and the patient consents.  The patient expressed understanding the option of not having the procedure and the risks of a scar.  Time out was called and all information was confirmed to be correct.    The area was prepped and drapped.  Lidocaine 1% with epinephrine mixed with marcaine 0.25% was injected in the subcutaneous area.  After waiting several minutes for the local to take affect a #15 blade was used to excise the area in an eliptical pattern with 2mm of skin to facilitate dissection.  The cyst was circumferentially dissected but was friable and it was removed piecemeal.  The incision site was inspected and no cyst was left behind.  The wound was washed with peroxide.  Hemostasis was achieved with Bovie electrocautery.  The wound was irrigated again and hemostasis was confirmed.  A 3-0 Vicryl was used to close the deep layers with simple interrupted stitches.  The skin edges were reapproximated with 4-0 Prolene interrupted closure.  A dressing was applied as well as triple antibiotic ointment.  The patient was given instructions on how to care for the area and a follow up appointment.  Omar Burns tolerated the procedure well and there were no complications.  Patient will receive 1 week of antibiotics due to the fact that the patient  said that he has been recently more tender and inflamed and the inflammation present during dissection.  The specimen was sent to pathology.  Omar Matzen M. Zoeie Ritter, MD [facility]

## 2023-11-13 LAB — SURGICAL PATHOLOGY

## 2023-11-25 ENCOUNTER — Ambulatory Visit

## 2023-11-25 DIAGNOSIS — L720 Epidermal cyst: Secondary | ICD-10-CM

## 2023-11-25 NOTE — Progress Notes (Signed)
   Established Patient Office Visit  Subjective   Patient ID: Omar Burns, male    DOB: Jun 23, 1980  Age: 43 y.o. MRN: [id number]  No chief complaint on file.   HPI 43 year old male status post right neck cyst excision.  Dimensions were 2 cm x 1.5 cm x 0.4 cm. Pathology report showed a benign epidermal cyst.  This was discussed with the patient.  Otherwise doing well.  Incision clean dry intact healing well.  No signs of infection.    Objective:     There were no vitals taken for this visit. BP Readings from Last 3 Encounters:  11/11/23 123/82  10/21/23 (!) 130/92      Physical Exam MA as chaperone. Incision clean dry intact with stitches in place.    Assessment & Plan:   Problem List Items Addressed This Visit   None Visit Diagnoses       Epidermal cyst of neck    -  Primary      43 year old male status post benign epidermal cyst excision from the right neck.  Doing well after surgery.  Stitches were removed today.  Patient tolerated well.  Recommended scar massage in 2 more weeks.  Patient will follow-up with me in 3 months.  All questions answered to patient's satisfaction.  Clema Skousen M Ellison Leisure, MD

## 2023-12-25 DIAGNOSIS — Z800 Family history of malignant neoplasm of digestive organs: Secondary | ICD-10-CM | POA: Diagnosis not present

## 2023-12-25 DIAGNOSIS — Z1211 Encounter for screening for malignant neoplasm of colon: Secondary | ICD-10-CM | POA: Diagnosis not present

## 2024-01-25 ENCOUNTER — Other Ambulatory Visit (HOSPITAL_COMMUNITY): Payer: Self-pay

## 2024-01-27 ENCOUNTER — Other Ambulatory Visit (HOSPITAL_COMMUNITY): Payer: Self-pay

## 2024-01-27 MED ORDER — DULCOLAX 5 MG PO TBEC
10.0000 mg | DELAYED_RELEASE_TABLET | Freq: Two times a day (BID) | ORAL | 0 refills | Status: AC
  Filled 2024-01-27: qty 4, 1d supply, fill #0

## 2024-01-27 MED ORDER — PEG 3350-KCL-NA BICARB-NACL 420 G PO SOLR
ORAL | 0 refills | Status: AC
  Filled 2024-01-27: qty 4000, 1d supply, fill #0

## 2024-02-26 ENCOUNTER — Ambulatory Visit

## 2024-02-26 VITALS — BP 138/84 | HR 91

## 2024-02-26 DIAGNOSIS — L720 Epidermal cyst: Secondary | ICD-10-CM

## 2024-02-26 NOTE — Progress Notes (Signed)
" ° °  Established Patient Office Visit  Subjective   Patient ID: Omar Burns, male    DOB: Nov 26, 1980  Age: 44 y.o. MRN: [id number]  Chief Complaint  Patient presents with   Follow-up    HPI  44 year old male status post right neck cyst excision.  Dimensions were 2 cm x 1.5 cm x 0.4 cm. Pathology report showed a benign epidermal cyst. Doing well. States he had headaches that resolved after excision. Incision has healed.  No signs of infection. Presents for 3 month follow up.     Objective:     BP 138/84 (BP Location: Right Arm, Patient Position: Sitting, Cuff Size: Normal)   Pulse 91   SpO2 96%  BP Readings from Last 3 Encounters:  02/26/24 138/84  11/11/23 123/82  10/21/23 (!) 130/92    Physical Exam MA as chaperone. Incision clean dry intact. No signs of infection.    Assessment & Plan:   Problem List Items Addressed This Visit   None Visit Diagnoses       Epidermal cyst of neck    -  Primary      44 year old male status post benign epidermal cyst excision from the right neck. Doing well after surgery. Recommended scar massage for 3 months. Patient will follow-up with me PRN. All questions answered to patient's satisfaction.   Natajah Derderian M Zilphia Kozinski, MD  "
